# Patient Record
Sex: Male | Born: 1950 | Race: Black or African American | Hispanic: No | Marital: Single | Smoking: Former smoker
Health system: Southern US, Community
[De-identification: ages and names within clinical notes are randomized; demographics above are authoritative.]

## PROBLEM LIST (undated history)

## (undated) DIAGNOSIS — C679 Malignant neoplasm of bladder, unspecified: Secondary | ICD-10-CM

## (undated) DIAGNOSIS — I219 Acute myocardial infarction, unspecified: Secondary | ICD-10-CM

## (undated) DIAGNOSIS — C801 Malignant (primary) neoplasm, unspecified: Secondary | ICD-10-CM

## (undated) DIAGNOSIS — Z95810 Presence of automatic (implantable) cardiac defibrillator: Secondary | ICD-10-CM

## (undated) HISTORY — PX: CORONARY STENT PLACEMENT: SHX1402

---

## 2015-08-19 ENCOUNTER — Emergency Department: Payer: Medicare Other

## 2015-08-19 ENCOUNTER — Encounter: Payer: Self-pay | Admitting: Emergency Medicine

## 2015-08-19 ENCOUNTER — Emergency Department
Admission: EM | Admit: 2015-08-19 | Discharge: 2015-08-19 | Disposition: A | Discharge status: Home / Self Care | Payer: Medicare Other | Attending: Emergency Medicine | Admitting: Emergency Medicine

## 2015-08-19 DIAGNOSIS — R1011 Right upper quadrant pain: Secondary | ICD-10-CM | POA: Diagnosis not present

## 2015-08-19 DIAGNOSIS — Z87891 Personal history of nicotine dependence: Secondary | ICD-10-CM | POA: Diagnosis not present

## 2015-08-19 DIAGNOSIS — Z79899 Other long term (current) drug therapy: Secondary | ICD-10-CM | POA: Insufficient documentation

## 2015-08-19 DIAGNOSIS — N189 Chronic kidney disease, unspecified: Secondary | ICD-10-CM | POA: Insufficient documentation

## 2015-08-19 DIAGNOSIS — Z7982 Long term (current) use of aspirin: Secondary | ICD-10-CM | POA: Insufficient documentation

## 2015-08-19 DIAGNOSIS — Z7902 Long term (current) use of antithrombotics/antiplatelets: Secondary | ICD-10-CM | POA: Insufficient documentation

## 2015-08-19 HISTORY — DX: Malignant neoplasm of bladder, unspecified: C67.9

## 2015-08-19 HISTORY — DX: Presence of automatic (implantable) cardiac defibrillator: Z95.810

## 2015-08-19 HISTORY — DX: Acute myocardial infarction, unspecified: I21.9

## 2015-08-19 HISTORY — DX: Malignant (primary) neoplasm, unspecified: C80.1

## 2015-08-19 LAB — CBC WITH DIFFERENTIAL/PLATELET
BASOS PCT: 1 %
Basophils Absolute: 0.1 10*3/uL (ref 0–0.1)
EOS ABS: 0.1 10*3/uL (ref 0–0.7)
EOS PCT: 1 %
HCT: 32.6 % — ABNORMAL LOW (ref 40.0–52.0)
HEMOGLOBIN: 10.2 g/dL — AB (ref 13.0–18.0)
Lymphocytes Relative: 12 %
Lymphs Abs: 1.6 10*3/uL (ref 1.0–3.6)
MCH: 28.4 pg (ref 26.0–34.0)
MCHC: 31.3 g/dL — AB (ref 32.0–36.0)
MCV: 90.9 fL (ref 80.0–100.0)
MONOS PCT: 13 %
Monocytes Absolute: 1.7 10*3/uL — ABNORMAL HIGH (ref 0.2–1.0)
NEUTROS PCT: 73 %
Neutro Abs: 9.5 10*3/uL — ABNORMAL HIGH (ref 1.4–6.5)
PLATELETS: 245 10*3/uL (ref 150–440)
RBC: 3.59 MIL/uL — ABNORMAL LOW (ref 4.40–5.90)
RDW: 22.3 % — ABNORMAL HIGH (ref 11.5–14.5)
WBC: 13 10*3/uL — AB (ref 3.8–10.6)

## 2015-08-19 LAB — COMPREHENSIVE METABOLIC PANEL
ALK PHOS: 108 U/L (ref 38–126)
ALT: 24 U/L (ref 17–63)
ANION GAP: 6 (ref 5–15)
AST: 30 U/L (ref 15–41)
Albumin: 3.7 g/dL (ref 3.5–5.0)
BILIRUBIN TOTAL: 0.2 mg/dL — AB (ref 0.3–1.2)
BUN: 38 mg/dL — ABNORMAL HIGH (ref 6–20)
CALCIUM: 9.2 mg/dL (ref 8.9–10.3)
CO2: 26 mmol/L (ref 22–32)
CREATININE: 2.11 mg/dL — AB (ref 0.61–1.24)
Chloride: 104 mmol/L (ref 101–111)
GFR calc non Af Amer: 31 mL/min — ABNORMAL LOW (ref >60)
GFR, EST AFRICAN AMERICAN: 36 mL/min — AB (ref >60)
GLUCOSE: 107 mg/dL — AB (ref 65–99)
Potassium: 4.5 mmol/L (ref 3.5–5.1)
Sodium: 136 mmol/L (ref 135–145)
TOTAL PROTEIN: 7 g/dL (ref 6.5–8.1)

## 2015-08-19 LAB — LIPASE, BLOOD: Lipase: 19 U/L — ABNORMAL LOW (ref 22–51)

## 2015-08-19 MED ORDER — HEPARIN SOD (PORK) LOCK FLUSH 100 UNIT/ML IV SOLN
500.0000 [IU] | Freq: Once | INTRAVENOUS | Status: AC
  Administered 2015-08-19: 500 [IU] via INTRAVENOUS
  Filled 2015-08-19: qty 5

## 2015-08-19 MED ORDER — OXYCODONE-ACETAMINOPHEN 5-325 MG PO TABS
1.0000 | ORAL_TABLET | Freq: Once | ORAL | Status: AC
  Administered 2015-08-19: 1 via ORAL
  Filled 2015-08-19: qty 1

## 2015-08-19 MED ORDER — OXYCODONE-ACETAMINOPHEN 5-325 MG PO TABS: 1.0000 | ORAL_TABLET | Freq: Four times a day (QID) | ORAL | Status: AC | PRN

## 2015-08-19 MED ORDER — HYDROMORPHONE HCL 1 MG/ML IJ SOLN
1.0000 mg | Freq: Once | INTRAMUSCULAR | Status: AC
  Administered 2015-08-19: 1 mg via INTRAVENOUS
  Filled 2015-08-19: qty 1

## 2015-08-19 NOTE — ED Notes (Signed)
Dr Joni Fears at the bedside, speaking w/pt and pt's son.

## 2015-08-19 NOTE — ED Notes (Signed)
Patient reports right sided abd pain since Tuesday.

## 2015-08-19 NOTE — Discharge Instructions (Signed)
You were prescribed a medication that is potentially sedating. Do not drink alcohol, drive or participate in any other potentially dangerous activities while taking this medication as it may make you sleepy. Do not take this medication with any other sedating medications, either prescription or over-the-counter. If you were prescribed Percocet or Vicodin, do not take these with acetaminophen (Tylenol) as it is already contained within these medications.   Opioid pain medications (or "narcotics") can be habit forming.  Use it as little as possible to achieve adequate pain control.  Do not use or use it with extreme caution if you have a history of opiate abuse or dependence.  If you are on a pain contract with your primary care doctor or a pain specialist, be sure to let them know you were prescribed this medication today from the [facility] Emergency Department.  This medication is intended for your use only - do not give any to anyone else and keep it in a secure place where nobody else, especially children and pets, have access to it.  It will also cause or worsen constipation, so you may want to consider taking an over-the-counter stool softener while you are taking this medication.   Abdominal Pain Many things can cause abdominal pain. Usually, abdominal pain is not caused by a disease and will improve without treatment. It can often be observed and treated at home. Your health care provider will do a physical exam and possibly order blood tests and X-rays to help determine the seriousness of your pain. However, in many cases, more time must pass before a clear cause of the pain can be found. Before that point, your health care provider may not know if you need more testing or further treatment. HOME CARE INSTRUCTIONS  Monitor your abdominal pain for any changes. The following actions may help to alleviate any discomfort you are experiencing:  Only take over-the-counter or prescription medicines  as directed by your health care provider.  Do not take laxatives unless directed to do so by your health care provider.  Try a clear liquid diet (broth, tea, or water) as directed by your health care provider. Slowly move to a bland diet as tolerated. SEEK MEDICAL CARE IF:  You have unexplained abdominal pain.  You have abdominal pain associated with nausea or diarrhea.  You have pain when you urinate or have a bowel movement.  You experience abdominal pain that wakes you in the night.  You have abdominal pain that is worsened or improved by eating food.  You have abdominal pain that is worsened with eating fatty foods.  You have a fever. SEEK IMMEDIATE MEDICAL CARE IF:   Your pain does not go away within 2 hours.  You keep throwing up (vomiting).  Your pain is felt only in portions of the abdomen, such as the right side or the left lower portion of the abdomen.  You pass bloody or black tarry stools. MAKE SURE YOU:  Understand these instructions.   Will watch your condition.   Will get help right away if you are not doing well or get worse.  Document Released: 09/24/2005 Document Revised: 12/20/2013 Document Reviewed: 08/24/2013 [facility] Patient Information 2015 [place], [place]. This information is not intended to replace advice given to you by your health care provider. Make sure you discuss any questions you have with your health care provider.

## 2015-08-19 NOTE — ED Provider Notes (Signed)
[facility] Emergency Department Provider Note  ____________________________________________  Time seen: 3:45 AM  I have reviewed the triage vital signs and the nursing notes.   HISTORY  Chief Complaint Abdominal Pain    HPI Gary Li is a 64 y.o. male who complains of right upper abdominal pain for 4 days. He traveled from [place] to his son's house here locally 5 days ago and was in his usual state of health at that time. Afterward he felt so tired from a trip that he took a nap in an arm chair at his son's house, leaning onto the armrest with his right side while he slept. When he woke up, his right abdomen was sore. He remains sore for the past 4 days, although he also notes that it is intermittent lasting about 20 minutes at a time. It is nonradiating, feels like a dull achy pain, no nausea vomiting diarrhea or other pain or shortness of breath. No syncope or diaphoresis.  He notes that he had a similar pain in his left abdomen about 2 months ago from leaning against a garden structure. That pain lingered for the past few months but is improving.     Past Medical History  Diagnosis Date  . Cancer   . Myocardial infarction   . Bladder cancer   . Automatic implantable cardioverter-defibrillator in situ     There are no active problems to display for this patient.   Past Surgical History  Procedure Laterality Date  . Coronary stent placement      Current Outpatient Rx  Name  Route  Sig  Dispense  Refill  . allopurinol (ZYLOPRIM) 300 MG tablet   Oral   Take 300 mg by mouth daily.         Marland Kitchen amiodarone (PACERONE) 200 MG tablet   Oral   Take 200 mg by mouth daily.         Marland Kitchen aspirin 325 MG EC tablet   Oral   Take 325 mg by mouth daily.         . carvedilol (COREG) 12.5 MG tablet   Oral   Take 12.5 mg by mouth 2 (two) times daily with a meal.         . clopidogrel (PLAVIX) 75 MG tablet   Oral   Take 75 mg by mouth daily.          . colchicine 0.6 MG tablet   Oral   Take 0.6 mg by mouth 2 (two) times daily.         Marland Kitchen ezetimibe (ZETIA) 10 MG tablet   Oral   Take 5 mg by mouth daily.         . furosemide (LASIX) 20 MG tablet   Oral   Take 20 mg by mouth daily.         Marland Kitchen lisinopril (PRINIVIL,ZESTRIL) 2.5 MG tablet   Oral   Take 1.25 mg by mouth daily.         Marland Kitchen LORazepam (ATIVAN) 0.5 MG tablet   Oral   Take 0.5 mg by mouth 2 (two) times daily.         . magnesium oxide (MAG-OX) 400 MG tablet   Oral   Take 400 mg by mouth 2 (two) times daily.         Marland Kitchen mexiletine (MEXITIL) 150 MG capsule   Oral   Take 150 mg by mouth 3 (three) times daily.         Marland Kitchen  traMADol (ULTRAM) 50 MG tablet   Oral   Take by mouth every 6 (six) hours as needed.         Marland Kitchen oxyCODONE-acetaminophen (ROXICET) 5-325 MG per tablet   Oral   Take 1 tablet by mouth every 6 (six) hours as needed for severe pain.   12 tablet   0     Allergies Niacin and related  History reviewed. No pertinent family history.  Social History Social History  Substance Use Topics  . Smoking status: Former Research scientist (life sciences)  . Smokeless tobacco: Never Used  . Alcohol Use: No    Review of Systems  Constitutional: No fever or chills. No weight changes Eyes:No blurry vision or double vision.  ENT: No sore throat. Cardiovascular: No chest pain. Respiratory: No dyspnea or cough. Gastrointestinal: Right-sided abdominal pain without, vomiting and diarrhea.  No BRBPR or melena. Genitourinary: Negative for dysuria, urinary retention, bloody urine, or difficulty urinating. Musculoskeletal: Negative for back pain. No joint swelling or pain. Skin: Negative for rash. Neurological: Negative for headaches, focal weakness or numbness. Psychiatric:No anxiety or depression.   Endocrine:No hot/cold intolerance, changes in energy, or sleep difficulty.  10-point ROS otherwise negative.  ____________________________________________   PHYSICAL  EXAM:  VITAL SIGNS: ED Triage Vitals  Enc Vitals Group     BP 08/19/15 0335 138/75 mmHg     Pulse Rate 08/19/15 0335 87     Resp 08/19/15 0335 18     Temp 08/19/15 0335 98.2 F (36.8 C)     Temp Source 08/19/15 0335 Oral     SpO2 08/19/15 0335 95 %     Weight 08/19/15 0335 200 lb (90.719 kg)     Height 08/19/15 0335 6\' 1"  (1.854 m)     Head Cir --      Peak Flow --      Pain Score 08/19/15 0336 5     Pain Loc --      Pain Edu? --      Excl. in [place]? --      Constitutional: Alert and oriented. Well appearing and in no distress. Eyes: No scleral icterus. No conjunctival pallor. PERRL. EOMI ENT   Head: Normocephalic and atraumatic.   Nose: No congestion/rhinnorhea. No septal hematoma   Mouth/Throat: MMM, no pharyngeal erythema. No peritonsillar mass. No uvula shift.   Neck: No stridor. No SubQ emphysema. No meningismus. Hematological/Lymphatic/Immunilogical: No cervical lymphadenopathy. Cardiovascular: RRR. Normal and symmetric distal pulses are present in all extremities. No murmurs, rubs, or gallops. Respiratory: Normal respiratory effort without tachypnea nor retractions. Breath sounds are clear and equal bilaterally. No wheezes/rales/rhonchi. Gastrointestinal: Soft with focal right upper quadrant tenderness. No distention. There is no CVA tenderness.  No rebound, rigidity, or guarding. Genitourinary: deferred Musculoskeletal: Nontender with normal range of motion in all extremities. No joint effusions.  No lower extremity tenderness.  No edema. Neurologic:   Normal speech and language.  CN 2-10 normal. Motor grossly intact. No pronator drift.  Normal gait. No gross focal neurologic deficits are appreciated.  Skin:  Skin is warm, dry and intact. No rash noted.  No petechiae, purpura, or bullae. Psychiatric: Mood and affect are normal. Speech and behavior are normal. Patient exhibits appropriate insight and  judgment.  ____________________________________________    LABS (pertinent positives/negatives) (all labs ordered are listed, but only abnormal results are displayed) Labs Reviewed  COMPREHENSIVE METABOLIC PANEL - Abnormal; Notable for the following:    Glucose, Bld 107 (*)    BUN 38 (*)    Creatinine,  Ser 2.11 (*)    Total Bilirubin 0.2 (*)    GFR calc non Af Amer 31 (*)    GFR calc Af Amer 36 (*)    All other components within normal limits  LIPASE, BLOOD - Abnormal; Notable for the following:    Lipase 19 (*)    All other components within normal limits  CBC WITH DIFFERENTIAL/PLATELET - Abnormal; Notable for the following:    WBC 13.0 (*)    RBC 3.59 (*)    Hemoglobin 10.2 (*)    HCT 32.6 (*)    MCHC 31.3 (*)    RDW 22.3 (*)    Neutro Abs 9.5 (*)    Monocytes Absolute 1.7 (*)    All other components within normal limits   ____________________________________________   EKG  Interpreted by me Normal sinus rhythm rate of 78, normal axis, normal intervals. Normal QRS and ST segments and T waves.  ____________________________________________    RADIOLOGY  Ultrasound reveals an unremarkable gallbladder. Liver does reveal multiple hepatic masses consistent with metastatic disease.  ____________________________________________   PROCEDURES  ____________________________________________   INITIAL IMPRESSION / ASSESSMENT AND PLAN / ED COURSE  Pertinent labs & imaging results that were available during my care of the patient were reviewed by me and considered in my medical decision making (see chart for details).  Patient well appearing presents with pain that is most likely due to musculoskeletal soreness related to prolonged pressure on the right abdominal wall. However with the focal tenderness on palpation, we'll check an ultrasound and labs. PERRLA suspicion for ACS PE TAD pneumothorax carditis AAA pneumonia or  sepsis. ----------------------------------------- 6:27 AM on 08/19/2015 -----------------------------------------  Workup negative for biliary pathology. The patient's multiple hepatic masses likely were subjected to some compression and external force while he was lying on the arm chair causing irritation. This should be self-limited and Expected to resolve on its own with symptomatic treatment. The patient's home tramadol is ineffective for this pain, so I'll prescribe Percocet. He'll follow up with primary care upon returning home. Review of Epic care everywhere and his baseline labs from [facility] revealed that the creatinine is 2.1 is his baseline and he has chronic renal insufficiency. ____________________________________________   FINAL CLINICAL IMPRESSION(S) / ED DIAGNOSES  Final diagnoses:  RUQ abdominal pain   chronic renal insufficiency    Carrie Mew, MD 08/19/15 [id number]

## 2015-08-19 NOTE — ED Notes (Signed)
Pt returned from US

## 2015-08-19 NOTE — ED Notes (Signed)
Patient transported to Ultrasound by Radiology Tech.

## 2015-10-30 DEATH — deceased

## 2017-03-27 IMAGING — US US ABDOMEN LIMITED
1 series · 14 of 25 positions shown · non-contrast
Comparison: None.

CLINICAL DATA: Right upper quadrant pain and tenderness for 3 days

EXAM:
US ABDOMEN LIMITED - RIGHT UPPER QUADRANT

[Series 1: us abdomen limited · 0.23mm/px · 14 of 51 slices shown]
[im 1/51]
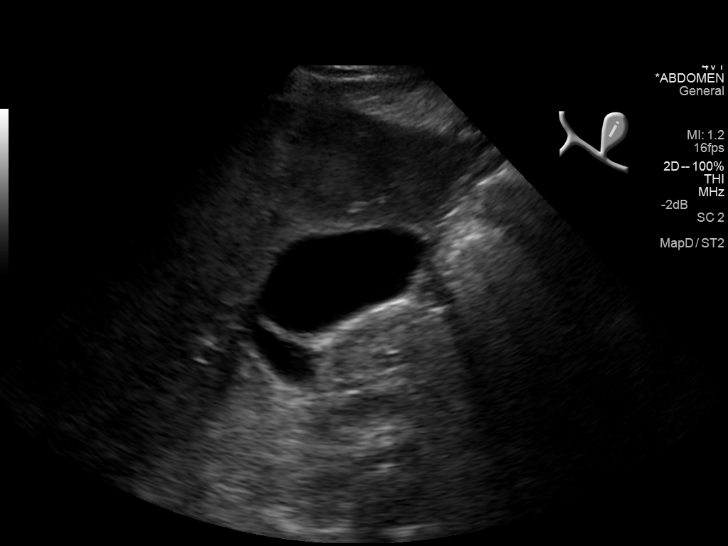
[im 5/51]
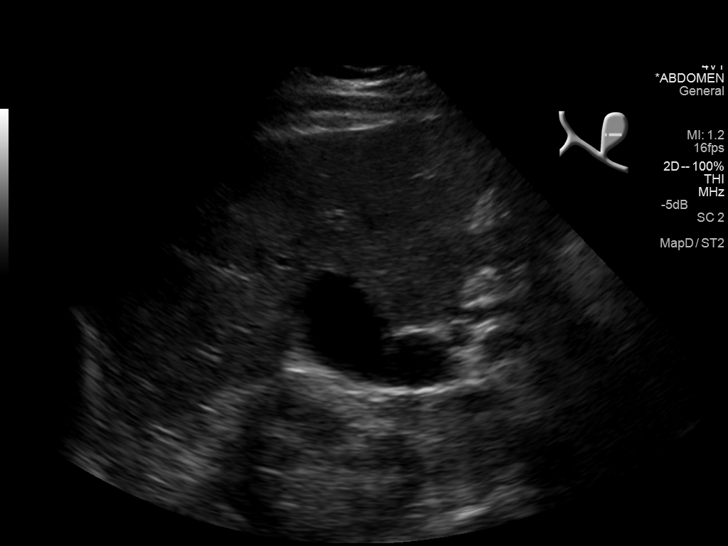
[im 9/51]
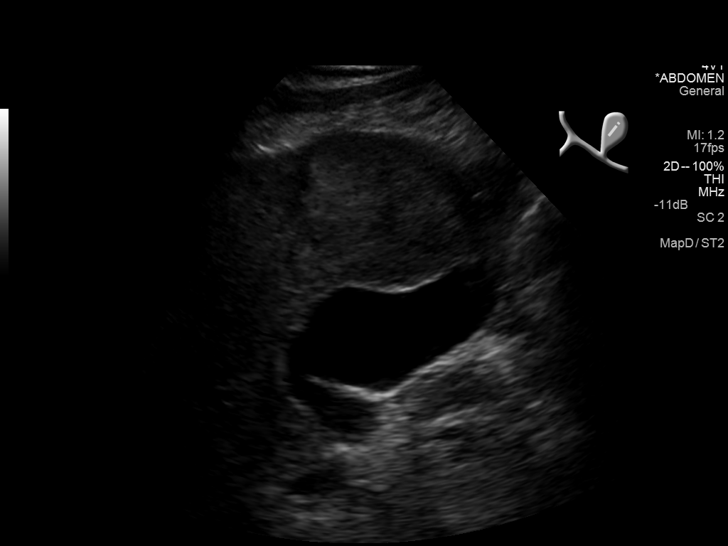
[im 13/51]
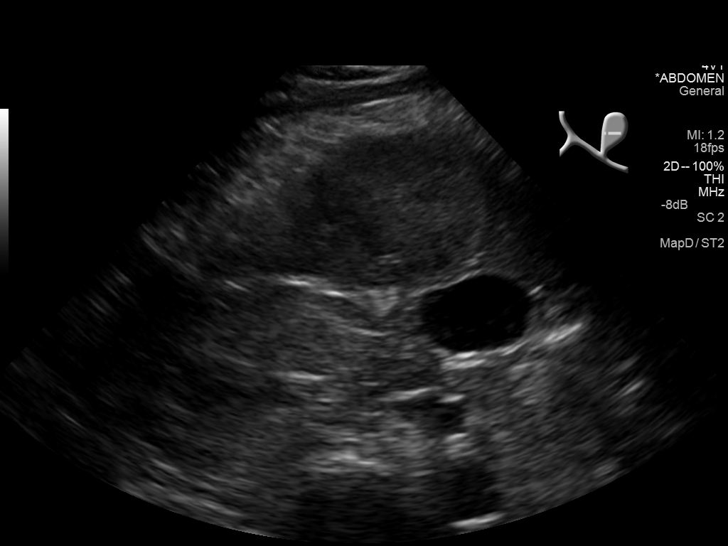
[im 17/51]
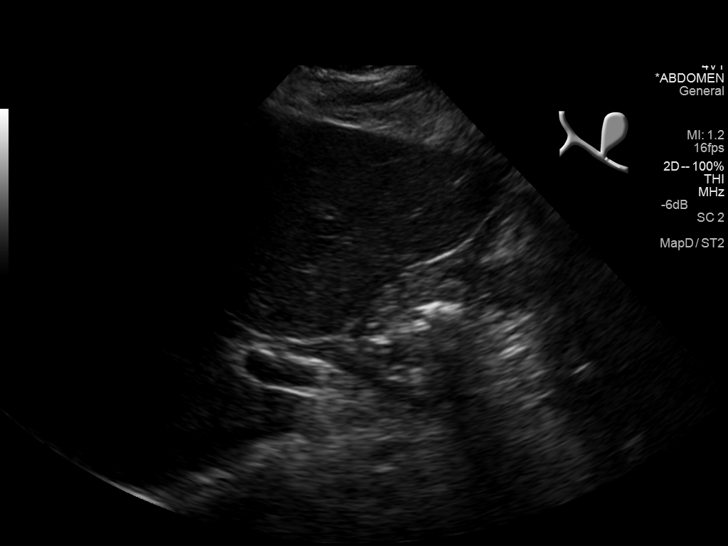
[im 19/51]
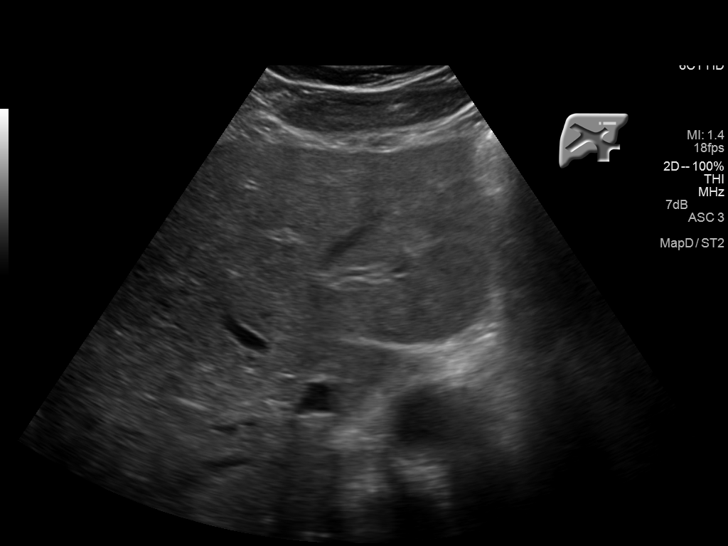
[im 23/51]
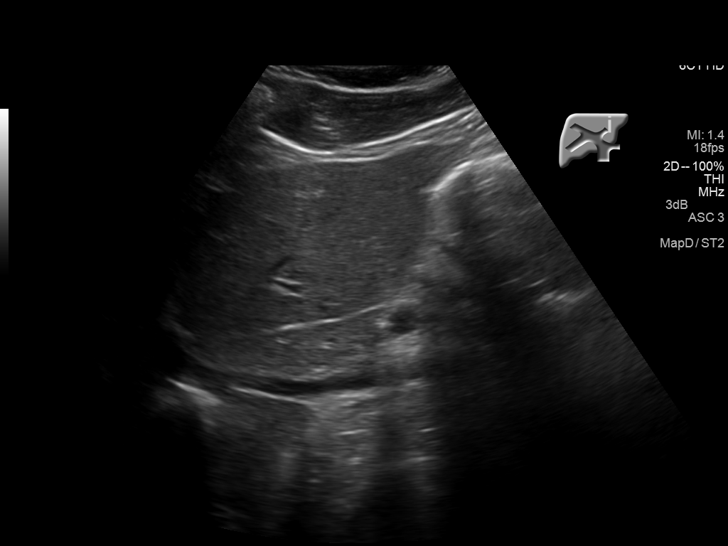
[im 28/51]
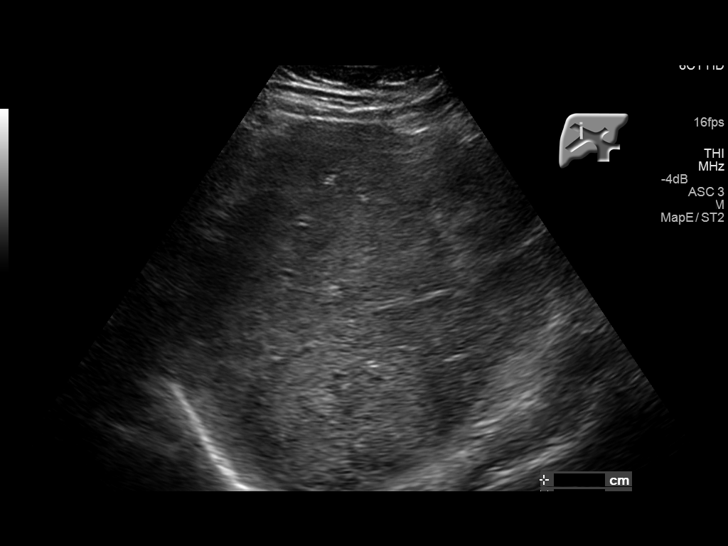
[im 32/51]
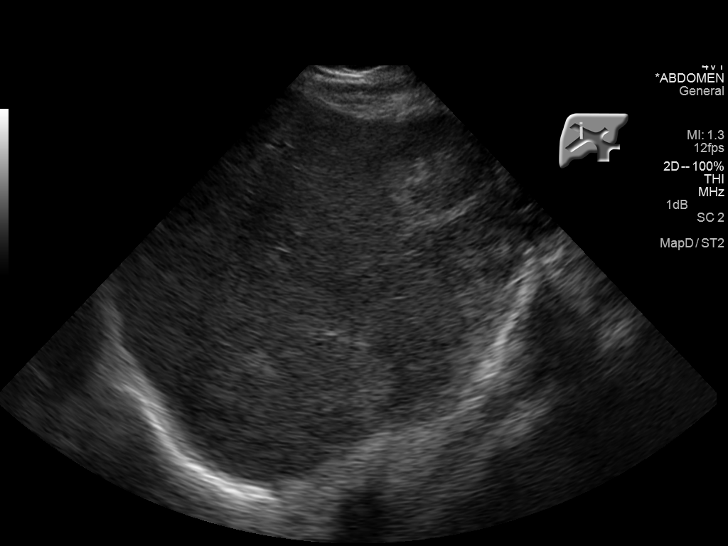
[im 34/51]
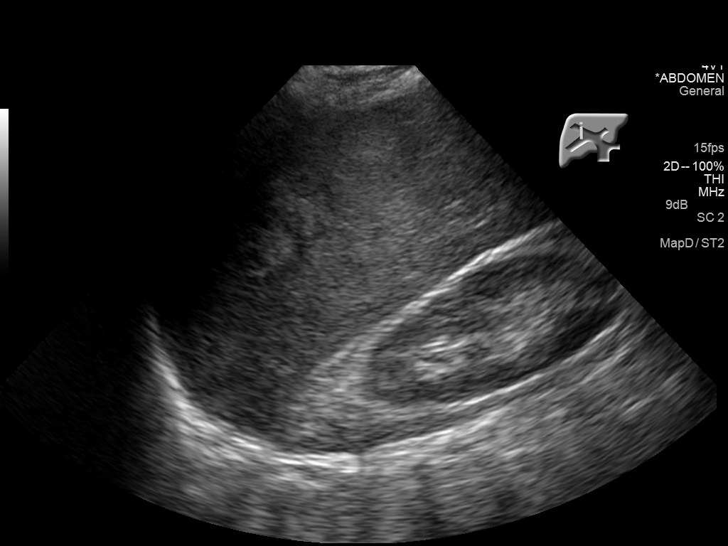
[im 38/51]
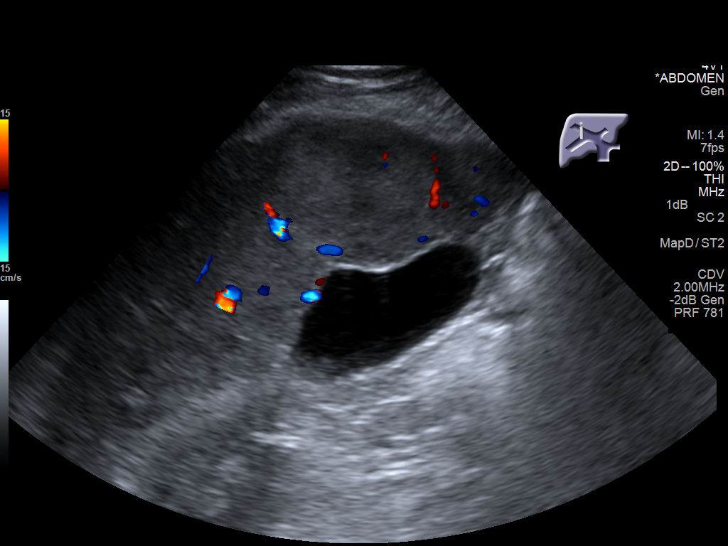
[im 42/51]
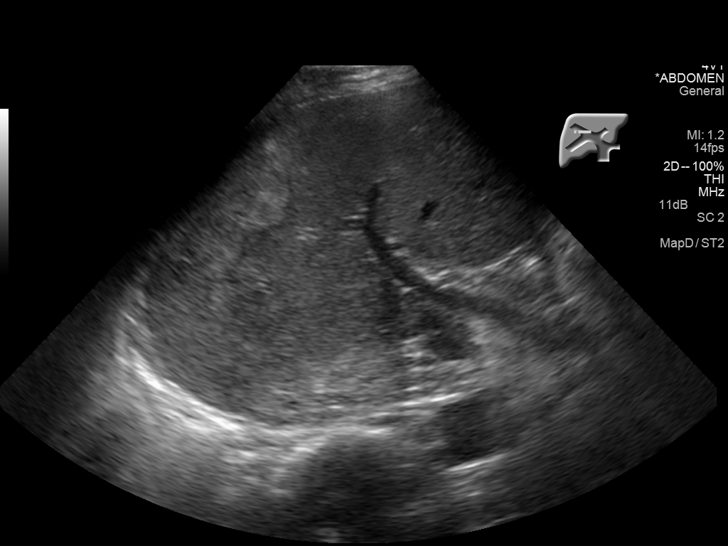
[im 46/51]
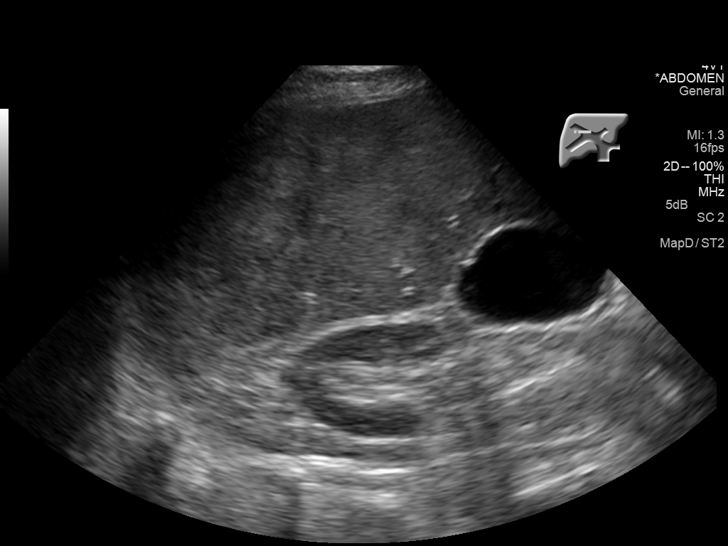
[im 51/51]
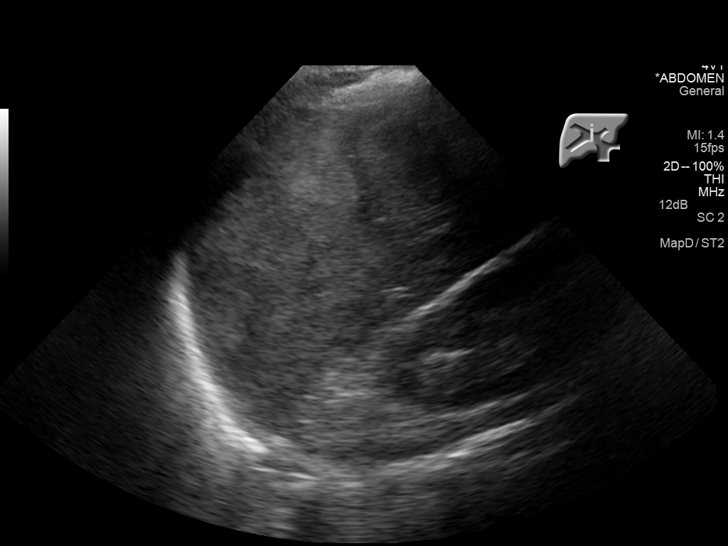

[14 of 25 positions shown; findings below may reference images not displayed]

FINDINGS: Gallbladder:

No gallstones or wall thickening visualized. No sonographic Murphy
sign noted.

Common bile duct:

Diameter: 4 mm

Liver:

There are innumerable iso to mildly hyperechoic masses throughout
the liver, many with hypoechoic halos. The largest is in the upper
right lobe measuring 9 cm. Antegrade flow in the imaged portal
venous system.
IMPRESSION: Numerous hepatic masses compatible with metastatic disease in this
patient with history of bladder cancer. The largest mass is in the
right liver measuring 9 cm.
